# Patient Record
Sex: Male | Born: 1990 | Race: White | Hispanic: No | Marital: Single | Smoking: Current every day smoker
Health system: Southern US, Community
[De-identification: ages and names within clinical notes are randomized; demographics above are authoritative.]

## PROBLEM LIST (undated history)

## (undated) ENCOUNTER — Ambulatory Visit: Admission: EM

## (undated) ENCOUNTER — Ambulatory Visit

## (undated) HISTORY — PX: WISDOM TOOTH EXTRACTION: SHX21

## (undated) HISTORY — PX: TONSILLECTOMY: SUR1361

---
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
## 2008-02-08 ENCOUNTER — Emergency Department (HOSPITAL_COMMUNITY): Admission: EM | Admit: 2008-02-08 | Discharge: 2008-02-08 | Payer: Self-pay | Admitting: Emergency Medicine

## 2013-03-12 ENCOUNTER — Encounter (HOSPITAL_COMMUNITY): Payer: Self-pay | Admitting: Emergency Medicine

## 2013-03-12 ENCOUNTER — Emergency Department (HOSPITAL_COMMUNITY)
Admission: EM | Admit: 2013-03-12 | Discharge: 2013-03-12 | Disposition: A | Discharge status: Home / Self Care | Payer: Self-pay | Attending: Emergency Medicine | Admitting: Emergency Medicine

## 2013-03-12 DIAGNOSIS — F17200 Nicotine dependence, unspecified, uncomplicated: Secondary | ICD-10-CM | POA: Insufficient documentation

## 2013-03-12 DIAGNOSIS — H9190 Unspecified hearing loss, unspecified ear: Secondary | ICD-10-CM | POA: Insufficient documentation

## 2013-03-12 DIAGNOSIS — Z792 Long term (current) use of antibiotics: Secondary | ICD-10-CM | POA: Insufficient documentation

## 2013-03-12 DIAGNOSIS — H60399 Other infective otitis externa, unspecified ear: Secondary | ICD-10-CM | POA: Insufficient documentation

## 2013-03-12 DIAGNOSIS — J029 Acute pharyngitis, unspecified: Secondary | ICD-10-CM | POA: Insufficient documentation

## 2013-03-12 DIAGNOSIS — H6091 Unspecified otitis externa, right ear: Secondary | ICD-10-CM

## 2013-03-12 MED ORDER — NEOMYCIN-POLYMYXIN-HC 1 % OT SOLN
4.0000 [drp] | Freq: Once | OTIC | Status: AC
  Administered 2013-03-12: 4 [drp] via OTIC

## 2013-03-12 MED ORDER — AMOXICILLIN 250 MG PO CAPS
500.0000 mg | ORAL_CAPSULE | Freq: Once | ORAL | Status: AC
  Administered 2013-03-12: 500 mg via ORAL
  Filled 2013-03-12: qty 2

## 2013-03-12 MED ORDER — AMOXICILLIN 500 MG PO CAPS: 500.0000 mg | ORAL_CAPSULE | Freq: Three times a day (TID) | ORAL | Status: DC

## 2013-03-12 MED ORDER — HYDROCODONE-ACETAMINOPHEN 5-325 MG PO TABS
1.0000 | ORAL_TABLET | Freq: Once | ORAL | Status: AC
  Administered 2013-03-12: 1 via ORAL
  Filled 2013-03-12: qty 1

## 2013-03-12 MED ORDER — NEOMYCIN-POLYMYXIN-HC 1 % OT SOLN
OTIC | Status: AC
  Filled 2013-03-12: qty 10

## 2013-03-12 MED ORDER — HYDROCODONE-ACETAMINOPHEN 5-325 MG PO TABS: 1.0000 | ORAL_TABLET | ORAL | Status: DC | PRN

## 2013-03-12 NOTE — ED Provider Notes (Signed)
Medical screening examination/treatment/procedure(s) were performed by non-physician practitioner and as supervising physician I was immediately available for consultation/collaboration.  Geoffery Lyons, MD 03/12/13 [phone]

## 2013-03-12 NOTE — ED Notes (Signed)
Pt c/o right ear pain x 2-3 days with difficulty hearing from right side.  C/o pain extending down into the jaw and into the left ear.

## 2013-03-12 NOTE — ED Provider Notes (Signed)
CSN: [id number]     Arrival date & time 03/12/13  2021 History   First MD Initiated Contact with Patient 03/12/13 2031     Chief Complaint  Patient presents with  . Otalgia   (Consider location/radiation/quality/duration/timing/severity/associated sxs/prior Treatment) Patient is a 22 y.o. male presenting with ear pain. The history is provided by the patient and a parent.  Otalgia Location:  Right Quality:  Aching, pressure and throbbing Severity:  Severe Onset quality:  Gradual Duration:  3 days Progression:  Worsening Chronicity:  New Context: not direct blow and not foreign body in ear   Relieved by:  Nothing Worsened by:  Swallowing Ineffective treatments:  OTC medications Associated symptoms: ear discharge, fever, hearing loss and sore throat   Associated symptoms: no abdominal pain, no congestion, no headaches, no neck pain, no rash, no tinnitus and no vomiting     History reviewed. No pertinent past medical history. Past Surgical History  Procedure Laterality Date  . Tonsillectomy     No family history on file. History  Substance Use Topics  . Smoking status: Current Every Day Smoker -- 0.75 packs/day    Types: Cigarettes  . Smokeless tobacco: Not on file  . Alcohol Use: Yes     Comment: occasional    Review of Systems  Constitutional: Positive for fever.  HENT: Positive for ear discharge, ear pain, hearing loss and sore throat. Negative for congestion and tinnitus.   Eyes: Negative.   Respiratory: Negative for chest tightness and shortness of breath.   Cardiovascular: Negative for chest pain.  Gastrointestinal: Negative for nausea, vomiting and abdominal pain.  Genitourinary: Negative.   Musculoskeletal: Negative for arthralgias, joint swelling and neck pain.  Skin: Negative.  Negative for rash and wound.  Neurological: Negative for dizziness, weakness, light-headedness, numbness and headaches.  Psychiatric/Behavioral: Negative.     Allergies  Review of  patient's allergies indicates no known allergies.  Home Medications   Current Outpatient Rx  Name  Route  Sig  Dispense  Refill  . amoxicillin (AMOXIL) 500 MG capsule   Oral   Take 1 capsule (500 mg total) by mouth 3 (three) times daily.   30 capsule   0   . HYDROcodone-acetaminophen (NORCO/VICODIN) 5-325 MG per tablet   Oral   Take 1 tablet by mouth every 4 (four) hours as needed for pain.   15 tablet   0    BP 149/67  Pulse 120  Temp(Src) 99.1 F (37.3 C) (Oral)  Resp 20  Ht 6\' 3"  (1.905 m)  Wt 285 lb (129.275 kg)  BMI 35.62 kg/m2  SpO2 100% Physical Exam  Constitutional: He is oriented to person, place, and time. He appears well-developed and well-nourished.  HENT:  Head: Normocephalic and atraumatic.  Right Ear: There is swelling and tenderness. No foreign bodies. No mastoid tenderness. Tympanic membrane is not injected.  Left Ear: Tympanic membrane and ear canal normal.  Nose: Mucosal edema and rhinorrhea present.  Mouth/Throat: Uvula is midline, oropharynx is clear and moist and mucous membranes are normal. No oropharyngeal exudate, posterior oropharyngeal edema, posterior oropharyngeal erythema or tonsillar abscesses.  Unable to visualize TM due to external canal edema. No drainage but edges of canal slightly wet.  Pain with ear traction.  Eyes: Conjunctivae are normal.  Cardiovascular: Normal rate and normal heart sounds.   Pulmonary/Chest: Effort normal. No respiratory distress. He has no wheezes. He has no rales.  Abdominal: Soft. There is no tenderness.  Musculoskeletal: Normal range of motion.  Neurological:  He is alert and oriented to person, place, and time.  Skin: Skin is warm and dry. No rash noted.  Psychiatric: He has a normal mood and affect.    ED Course  Procedures (including critical care time)   Ear wick placed in right ear canal without difficulty,  Pt tolerated well.  Given first dose of cortisporin, amoxil here.   Labs Review Labs  Reviewed - No data to display Imaging Review No results found.  EKG Interpretation   None       MDM   1. Otitis externa of right ear    Otitis externa with possible otitis media given fevers noted at home.  He will be covered with cortisporin but also with amoxil.  Hydrocodone for pain relief.  Ibuprofen.  Recheck in 2 days for wick removal.    Burgess Amor, PA-C 03/12/13 2136

## 2014-03-27 ENCOUNTER — Emergency Department (HOSPITAL_COMMUNITY)
Admission: EM | Admit: 2014-03-27 | Discharge: 2014-03-27 | Disposition: A | Discharge status: Home / Self Care | Payer: Self-pay | Attending: Emergency Medicine | Admitting: Emergency Medicine

## 2014-03-27 ENCOUNTER — Encounter (HOSPITAL_COMMUNITY): Payer: Self-pay | Admitting: Emergency Medicine

## 2014-03-27 DIAGNOSIS — H02811 Retained foreign body in right upper eyelid: Secondary | ICD-10-CM | POA: Insufficient documentation

## 2014-03-27 DIAGNOSIS — Y9389 Activity, other specified: Secondary | ICD-10-CM | POA: Insufficient documentation

## 2014-03-27 DIAGNOSIS — Z23 Encounter for immunization: Secondary | ICD-10-CM | POA: Insufficient documentation

## 2014-03-27 DIAGNOSIS — Z720 Tobacco use: Secondary | ICD-10-CM | POA: Insufficient documentation

## 2014-03-27 DIAGNOSIS — Z1889 Other specified retained foreign body fragments: Secondary | ICD-10-CM | POA: Insufficient documentation

## 2014-03-27 DIAGNOSIS — Z79899 Other long term (current) drug therapy: Secondary | ICD-10-CM | POA: Insufficient documentation

## 2014-03-27 DIAGNOSIS — Y9289 Other specified places as the place of occurrence of the external cause: Secondary | ICD-10-CM | POA: Insufficient documentation

## 2014-03-27 DIAGNOSIS — Z792 Long term (current) use of antibiotics: Secondary | ICD-10-CM | POA: Insufficient documentation

## 2014-03-27 DIAGNOSIS — X58XXXA Exposure to other specified factors, initial encounter: Secondary | ICD-10-CM | POA: Insufficient documentation

## 2014-03-27 DIAGNOSIS — S0501XA Injury of conjunctiva and corneal abrasion without foreign body, right eye, initial encounter: Secondary | ICD-10-CM | POA: Insufficient documentation

## 2014-03-27 MED ORDER — SULFACETAMIDE SODIUM 10 % OP SOLN: 2.0000 [drp] | OPHTHALMIC | Status: DC

## 2014-03-27 MED ORDER — TETANUS-DIPHTH-ACELL PERTUSSIS 5-2.5-18.5 LF-MCG/0.5 IM SUSP
0.5000 mL | Freq: Once | INTRAMUSCULAR | Status: AC
  Administered 2014-03-27: 0.5 mL via INTRAMUSCULAR
  Filled 2014-03-27: qty 0.5

## 2014-03-27 MED ORDER — TETRACAINE HCL 0.5 % OP SOLN
OPHTHALMIC | Status: AC
  Filled 2014-03-27: qty 2

## 2014-03-27 MED ORDER — FLUORESCEIN SODIUM 1 MG OP STRP
ORAL_STRIP | OPHTHALMIC | Status: AC
  Filled 2014-03-27: qty 1

## 2014-03-27 NOTE — ED Notes (Signed)
Tetracaine and fluorescein strip used by Dr. Preston FleetingGlick prior to examining eye.

## 2014-03-27 NOTE — ED Provider Notes (Signed)
CSN: [id number]     Arrival date & time 03/27/14  0520 History   First MD Initiated Contact with Patient 03/27/14 [phone]     Chief Complaint  Patient presents with  . Eye Problem     (Consider location/radiation/quality/duration/timing/severity/associated sxs/prior Treatment) Patient is a 23 y.o. male presenting with eye problem. The history is provided by the patient.  Eye Problem He was working underneath a car when he felt something fall into his right eye. He has a foreign body sensation in his eye. He tried to rinse the eye out but it continues to have the same sensation. He rates pain at 1/10. He states it's more irritating than painful. It feels similar to how his eyes felt when he had a welder's burn. He does not know when his last tetanus immunization was. He denies other injury.  History reviewed. No pertinent past medical history. Past Surgical History  Procedure Laterality Date  . Tonsillectomy     History reviewed. No pertinent family history. History  Substance Use Topics  . Smoking status: Current Every Day Smoker -- 0.75 packs/day    Types: Cigarettes  . Smokeless tobacco: Not on file  . Alcohol Use: Yes     Comment: occasional    Review of Systems  All other systems reviewed and are negative.     Allergies  Review of patient's allergies indicates no known allergies.  Home Medications   Prior to Admission medications   Medication Sig Start Date End Date Taking? Authorizing Provider  amoxicillin (AMOXIL) 500 MG capsule Take 1 capsule (500 mg total) by mouth 3 (three) times daily. 03/12/13   Burgess AmorJulie Idol, PA-C  HYDROcodone-acetaminophen (NORCO/VICODIN) 5-325 MG per tablet Take 1 tablet by mouth every 4 (four) hours as needed for pain. 03/12/13   Burgess AmorJulie Idol, PA-C   BP 135/75 mmHg  Pulse 92  Temp(Src) 98.1 F (36.7 C) (Oral)  Resp 18  SpO2 98% Physical Exam  Nursing note and vitals reviewed.  23 year old male, resting comfortably and in no acute distress.  Vital signs are normal. Oxygen saturation is 98%, which is normal. Head is normocephalic and atraumatic. PERRLA, EOMI. Oropharynx is clear.there is moderate erythema of the conjunctiva the right. No foreign body is seen on direct evaluation without magnification. Anterior chamber is clear.upper and lower conjunctival sacs were inspected after eversion of lids and no foreign bodies were seen. Neck is nontender and supple without adenopathy or JVD. Back is nontender and there is no CVA tenderness. Lungs are clear without rales, wheezes, or rhonchi. Chest is nontender. Heart has regular rate and rhythm without murmur. Abdomen is soft, flat, nontender without masses or hepatosplenomegaly and peristalsis is normoactive. Extremities have no cyanosis or edema, full range of motion is present. Skin is warm and dry without rash. Neurologic: Mental status is normal, cranial nerves are intact, there are no motor or sensory deficits.  ED Course  Procedures (including critical care time)  Slit lamp exam: no corneal foreign body seen. Anterior chambers clear. Eye was stained with floor seen and examined with cobalt blue filter and there were numerous shallow abrasions noted on the upper half of the cornea which all appeared to be U-shaped. This was suspicious for corneal abrasions from foreign body in the upper lid conjunctiva. Lids were everted again and examined under magnification and small foreign body was found and removed with cotton tip applicator.  MDM   Final diagnoses:  Retained foreign body of right upper eyelid  Injury of  conjunctiva and corneal abrasion of right eye without foreign body, initial encounter    Forearm body of the conjunctiva of the right upper eyelid with secondary corneal abrasion. He is discharged with a prescription for sulfacetamide ophthalmic solution. He was offered prescription for narcotic painkillers but stated that he did not want any medication like that. TDaP booster  is given. He is referred to ophthalmology for follow-up if not improving over the next several days.    Dione Boozeavid Laurine Kuyper, MD 03/27/14 [phone]

## 2014-03-27 NOTE — Discharge Instructions (Signed)
Corneal Abrasion The cornea is the clear covering at the front and center of the eye. When looking at the colored portion of the eye (iris), you are looking through the cornea. This very thin tissue is made up of many layers. The surface layer is a single layer of cells (corneal epithelium) and is one of the most sensitive tissues in the body. If a scratch or injury causes the corneal epithelium to come off, it is called a corneal abrasion. If the injury extends to the tissues below the epithelium, the condition is called a corneal ulcer. CAUSES   Scratches.  Trauma.  Foreign body in the eye. Some people have recurrences of abrasions in the area of the original injury even after it has healed (recurrent erosion syndrome). Recurrent erosion syndrome generally improves and goes away with time. SYMPTOMS   Eye pain.  Difficulty or inability to keep the injured eye open.  The eye becomes very sensitive to light.  Recurrent erosions tend to happen suddenly, first thing in the morning, usually after waking up and opening the eye. DIAGNOSIS  Your health care provider can diagnose a corneal abrasion during an eye exam. Dye is usually placed in the eye using a drop or a small paper strip moistened by your tears. When the eye is examined with a special light, the abrasion shows up clearly because of the dye. TREATMENT   Small abrasions may be treated with antibiotic drops or ointment alone.  A pressure patch may be put over the eye. If this is done, follow your doctor's instructions for when to remove the patch. Do not drive or use machines while the eye patch is on. Judging distances is hard to do with a patch on. If the abrasion becomes infected and spreads to the deeper tissues of the cornea, a corneal ulcer can result. This is serious because it can cause corneal scarring. Corneal scars interfere with light passing through the cornea and cause a loss of vision in the involved eye. HOME CARE  INSTRUCTIONS  Use medicine or ointment as directed. Only take over-the-counter or prescription medicines for pain, discomfort, or fever as directed by your health care provider.  Do not drive or operate machinery if your eye is patched. Your ability to judge distances is impaired.  If your health care provider has given you a follow-up appointment, it is very important to keep that appointment. Not keeping the appointment could result in a severe eye infection or permanent loss of vision. If there is any problem keeping the appointment, let your health care provider know. SEEK MEDICAL CARE IF:   You have pain, light sensitivity, and a scratchy feeling in one eye or both eyes.  Your pressure patch keeps loosening up, and you can blink your eye under the patch after treatment.  Any kind of discharge develops from the eye after treatment or if the lids stick together in the morning.  You have the same symptoms in the morning as you did with the original abrasion days, weeks, or months after the abrasion healed. MAKE SURE YOU:   Understand these instructions.  Will watch your condition.  Will get help right away if you are not doing well or get worse. Document Released: 05/06/2000 Document Revised: 05/14/2013 Document Reviewed: 01/14/2013 [facility] Patient Information 2015 [place], [place]. This information is not intended to replace advice given to you by your health care provider. Make sure you discuss any questions you have with your health care provider.  Prednisolone; Sulfacetamide  eye solution or suspension What is this medicine? PREDNISOLONE; SULFACETAMIDE (pred NISS oh lone; sul fa SEE ta mide) is a combination of a steroid and a sulfa antibiotic. It helps to reduce swelling, redness, and itching of the eye. It also is used to treat eye infections. This medicine may be used for other purposes; ask your health care provider or pharmacist if you have questions. COMMON BRAND NAME(S):  Blephamide, Optimyd, Sulster, Vasocidin What should I tell my health care provider before I take this medicine? They need to know if you have any of these conditions: -infection (especially a viral infection such as chickenpox, cold sores, or herpes) -cataracts -glaucoma -wear contact lenses -an unusual or allergic reaction to sulfa drugs, corticosteroids, other medicines, foods, dyes, or preservatives -pregnant or trying to get pregnant -breast-feeding How should I use this medicine? This medicine is only for use in the eye. Do not take by mouth. Follow the directions on the prescription label. Wash hands before and after use. Shake well before using. Tilt your head back slightly and pull your lower eyelid down with your index finger to form a pouch. Try not to touch the tip of the dropper to your eye, fingertips, or any other surface. Squeeze the prescribed number of drops into the pouch. Close the eye gently to spread the drops. Your vision may blur for a few minutes. Use your doses at regular intervals. Do not use your medicine more often than directed. Finish the full course prescribed by your doctor or health care professional even if you think your condition is better. Do not stop using except on the advice of your doctor or health care professional. Talk to your pediatrician regarding the use of this medicine in children. Special care may be needed. Overdosage: If you think you have taken too much of this medicine contact a poison control center or emergency room at once. NOTE: This medicine is only for you. Do not share this medicine with others. What if I miss a dose? If you miss a dose, use it as soon as you can. If it is almost time for your next dose, use only that dose. Do not use double or extra doses. What may interact with this medicine? Do not use any other eye products without talking to your doctor or health care professional. -anesthetics applied topically to the eye This  list may not describe all possible interactions. Give your health care provider a list of all the medicines, herbs, non-prescription drugs, or dietary supplements you use. Also tell them if you smoke, drink alcohol, or use illegal drugs. Some items may interact with your medicine. What should I watch for while using this medicine? Check with your health care provider if your condition does not start to get better after 2 days, or if it gets worse. Check with your health care provider before using this medicine for any future eye problems. If you get any sign of an allergic reaction, stop using your eye product and call your doctor or health care professional for advice. Wear sunglasses if this medicine makes your eyes more sensitive to light. What side effects may I notice from receiving this medicine? Side effects that you should report to your doctor or health care professional as soon as possible: -allergic reactions like skin rash, itching or hives, swelling of the face, lips, or tongue -changes in vision -eye pain -severe burning, stinging or swelling of the eyelids Side effects that usually do not require medical attention (report  to your doctor or health care professional if they continue or are bothersome): -blurred vision for a few moments after application -temporary redness or stinging for a few moments after application -temporary watering of eyes This list may not describe all possible side effects. Call your doctor for medical advice about side effects. You may report side effects to FDA at 1-800-FDA-1088. Where should I keep my medicine? Keep out of the reach of children. Store at room temperature between 15 and 30 degrees C (59 and 86 degrees F). Do not freeze. Protect from light. Throw away any unused medicine after the expiration date. NOTE: This sheet is a summary. It may not cover all possible information. If you have questions about this medicine, talk to your doctor,  pharmacist, or health care provider.  2015, [employer]vier/[employer] Standard. (2010-06-16 13:25:56)  Td Vaccine (Tetanus and Diphtheria): What You Need to Know 1. Why get vaccinated? Tetanus  and diphtheria are very serious diseases. They are rare in the [place] today, but people who do become infected often have severe complications. Td vaccine is used to protect adolescents and adults from both of these diseases. Both tetanus and diphtheria are infections caused by bacteria. Diphtheria spreads from person to person through coughing or sneezing. Tetanus-causing bacteria enter the body through cuts, scratches, or wounds. TETANUS (Lockjaw) causes painful muscle tightening and stiffness, usually all over the body.  It can lead to tightening of muscles in the head and neck so you can't open your mouth, swallow, or sometimes even breathe. Tetanus kills about 1 out of every 5 people who are infected. DIPHTHERIA can cause a thick coating to form in the back of the throat.  It can lead to breathing problems, paralysis, heart failure, and death. Before vaccines, the [place] saw as many as 200,000 cases a year of diphtheria and hundreds of cases of tetanus. Since vaccination began, cases of both diseases have dropped by about 99%. 2. Td vaccine Td vaccine can protect adolescents and adults from tetanus and diphtheria. Td is usually given as a booster dose every 10 years but it can also be given earlier after a severe and dirty wound or burn. Your doctor can give you more information. Td may safely be given at the same time as other vaccines. 3. Some people should not get this vaccine  If you ever had a life-threatening allergic reaction after a dose of any tetanus or diphtheria containing vaccine, OR if you have a severe allergy to any part of this vaccine, you should not get Td. Tell your doctor if you have any severe allergies.  Talk to your doctor if you:  have epilepsy or another nervous system  problem,  had severe pain or swelling after any vaccine containing diphtheria or tetanus,  ever had Guillain Barr Syndrome (GBS),  aren't feeling well on the day the shot is scheduled. 4. Risks of a vaccine reaction With a vaccine, like any medicine, there is a chance of side effects. These are usually mild and go away on their own. Serious side effects are also possible, but are very rare. Most people who get Td vaccine do not have any problems with it. Mild Problems  following Td (Did not interfere with activities)  Pain where the shot was given (about 8 people in 10)  Redness or swelling where the shot was given (about 1 person in 3)  Mild fever (about 1 person in 15)  Headache or Tiredness (uncommon) Moderate Problems following Td (Interfered with  activities, but did not require medical attention)  Fever over 102F (rare) Severe Problems  following Td (Unable to perform usual activities; required medical attention)  Swelling, severe pain, bleeding and/or redness in the arm where the shot was given (rare). Problems that could happen after any vaccine:  Brief fainting spells can happen after any medical procedure, including vaccination. Sitting or lying down for about 15 minutes can help prevent fainting, and injuries caused by a fall. Tell your doctor if you feel dizzy, or have vision changes or ringing in the ears.  Severe shoulder pain and reduced range of motion in the arm where a shot was given can happen, very rarely, after a vaccination.  Severe allergic reactions from a vaccine are very rare, estimated at less than 1 in a million doses. If one were to occur, it would usually be within a few minutes to a few hours after the vaccination. 5. What if there is a serious reaction? What should I look for?  Look for anything that concerns you, such as signs of a severe allergic reaction, very high fever, or behavior changes. Signs of a severe allergic reaction can include  hives, swelling of the face and throat, difficulty breathing, a fast heartbeat, dizziness, and weakness. These would usually start a few minutes to a few hours after the vaccination. What should I do?  If you think it is a severe allergic reaction or other emergency that can't wait, call 9-1-1 or get the person to the nearest hospital. Otherwise, call your doctor.  Afterward, the reaction should be reported to the Vaccine Adverse Event Reporting System (VAERS). Your doctor might file this report, or you can do it yourself through the VAERS web site at www.vaers.LAgents.no, or by calling 1-847-741-6307. VAERS is only for reporting reactions. They do not give medical advice. 6. The National Vaccine Injury Compensation Program The [employer] Vaccine Injury Compensation Program (VICP) is a federal program that was created to compensate people who may have been injured by certain vaccines. Persons who believe they may have been injured by a vaccine can learn about the program and about filing a claim by calling 1-236-636-8136 or visiting the VICP website at SpiritualWord.at. 7. How can I learn more?  Ask your doctor.  Contact your local or state health department.  Contact the Centers for Disease Control and Prevention (CDC):  Call 684-272-3464 (1-800-CDC-INFO)  Visit CDC's website at PicCapture.uy CDC Td Vaccine Interim VIS (06/26/12) Document Released: 03/06/2006 Document Revised: 09/23/2013 Document Reviewed: 08/21/2013 [facility] Patient Information 2015 [place], [place]. This information is not intended to replace advice given to you by your health care provider. Make sure you discuss any questions you have with your health care provider.

## 2014-03-27 NOTE — ED Notes (Signed)
Patient was working on a car when he felt something fall in his right eye.

## 2016-11-07 ENCOUNTER — Encounter (HOSPITAL_COMMUNITY): Payer: Self-pay | Admitting: Emergency Medicine

## 2016-11-07 ENCOUNTER — Emergency Department (HOSPITAL_COMMUNITY): Payer: Self-pay

## 2016-11-07 ENCOUNTER — Emergency Department (HOSPITAL_COMMUNITY)
Admission: EM | Admit: 2016-11-07 | Discharge: 2016-11-07 | Disposition: A | Discharge status: Home / Self Care | Payer: Self-pay | Attending: Emergency Medicine | Admitting: Emergency Medicine

## 2016-11-07 DIAGNOSIS — F17210 Nicotine dependence, cigarettes, uncomplicated: Secondary | ICD-10-CM | POA: Insufficient documentation

## 2016-11-07 DIAGNOSIS — R509 Fever, unspecified: Secondary | ICD-10-CM | POA: Insufficient documentation

## 2016-11-07 LAB — CBC WITH DIFFERENTIAL/PLATELET
Basophils Absolute: 0.2 10*3/uL — ABNORMAL HIGH (ref 0.0–0.1)
Basophils Relative: 2 %
EOS ABS: 0.2 10*3/uL (ref 0.0–0.7)
Eosinophils Relative: 1 %
HCT: 47.3 % (ref 39.0–52.0)
HEMOGLOBIN: 16.3 g/dL (ref 13.0–17.0)
Lymphocytes Relative: 47 %
Lymphs Abs: 5.7 10*3/uL — ABNORMAL HIGH (ref 0.7–4.0)
MCH: 28.8 pg (ref 26.0–34.0)
MCHC: 34.5 g/dL (ref 30.0–36.0)
MCV: 83.6 fL (ref 78.0–100.0)
Monocytes Absolute: 1.2 10*3/uL — ABNORMAL HIGH (ref 0.1–1.0)
Monocytes Relative: 10 %
NEUTROS ABS: 4.7 10*3/uL (ref 1.7–7.7)
Neutrophils Relative %: 39 %
Platelets: 222 10*3/uL (ref 150–400)
RBC: 5.66 MIL/uL (ref 4.22–5.81)
RDW: 13.3 % (ref 11.5–15.5)
WBC: 12 10*3/uL — ABNORMAL HIGH (ref 4.0–10.5)

## 2016-11-07 LAB — COMPREHENSIVE METABOLIC PANEL
ALT: 46 U/L (ref 17–63)
AST: 36 U/L (ref 15–41)
Albumin: 3.6 g/dL (ref 3.5–5.0)
Alkaline Phosphatase: 67 U/L (ref 38–126)
Anion gap: 9 (ref 5–15)
BUN: 6 mg/dL (ref 6–20)
CO2: 24 mmol/L (ref 22–32)
Calcium: 8.7 mg/dL — ABNORMAL LOW (ref 8.9–10.3)
Chloride: 100 mmol/L — ABNORMAL LOW (ref 101–111)
Creatinine, Ser: 1.02 mg/dL (ref 0.61–1.24)
GFR calc non Af Amer: >60 mL/min (ref >60)
Glucose, Bld: 123 mg/dL — ABNORMAL HIGH (ref 65–99)
Potassium: 3.9 mmol/L (ref 3.5–5.1)
SODIUM: 133 mmol/L — AB (ref 135–145)
TOTAL PROTEIN: 7.3 g/dL (ref 6.5–8.1)
Total Bilirubin: 0.6 mg/dL (ref 0.3–1.2)

## 2016-11-07 MED ORDER — DOXYCYCLINE HYCLATE 100 MG PO TABS
100.0000 mg | ORAL_TABLET | Freq: Once | ORAL | Status: AC
  Administered 2016-11-07: 100 mg via ORAL
  Filled 2016-11-07: qty 1

## 2016-11-07 MED ORDER — ACETAMINOPHEN 325 MG PO TABS
650.0000 mg | ORAL_TABLET | Freq: Once | ORAL | Status: AC
  Administered 2016-11-07: 650 mg via ORAL
  Filled 2016-11-07: qty 2

## 2016-11-07 MED ORDER — DOXYCYCLINE HYCLATE 100 MG PO CAPS: 100.0000 mg | ORAL_CAPSULE | Freq: Two times a day (BID) | ORAL | 0 refills | Status: DC

## 2016-11-07 NOTE — ED Triage Notes (Signed)
PT states intermittent fever with hot/cold chills, body aches, headache, and nausea x3 weeks. PT denies any OTC medications today. PT states increased generalized weakness and denies any urinary symptoms.

## 2016-11-07 NOTE — ED Provider Notes (Signed)
[facility]EM[facility]ENCY DEPT Provider Note   CSN: [id number] Arrival date & time: 11/07/16  1453     History   Chief Complaint Chief Complaint  Patient presents with  . Fever    HPI Bobby Crane is a 26 y.o. male.  The history is provided by the patient. No language interpreter was used.  Fever   This is a new problem. Episode onset: 3 weeks. The problem occurs constantly. The problem has not changed since onset.The maximum temperature noted was 101 to 101.9 F. Associated symptoms include vomiting and headaches. Pertinent negatives include no chest pain, no sore throat, no muscle aches and no cough. He has tried nothing for the symptoms. The treatment provided no relief.  Pt reports he has had a headache for 3 weeks.  Pt reports he has had fevers on and off for 3 weeks.  History reviewed. No pertinent past medical history.  There are no active problems to display for this patient.   Past Surgical History:  Procedure Laterality Date  . TONSILLECTOMY    . WISDOM TOOTH EXTRACTION         Home Medications    Prior to Admission medications   Medication Sig Start Date End Date Taking? Authorizing Provider  amoxicillin (AMOXIL) 500 MG capsule Take 1 capsule (500 mg total) by mouth 3 (three) times daily. 03/12/13   Burgess AmorIdol, Julie, PA-C  doxycycline (VIBRAMYCIN) 100 MG capsule Take 1 capsule (100 mg total) by mouth 2 (two) times daily. 11/07/16   Elson AreasSofia, Leslie K, PA-C  HYDROcodone-acetaminophen (NORCO/VICODIN) 5-325 MG per tablet Take 1 tablet by mouth every 4 (four) hours as needed for pain. 03/12/13   Burgess AmorIdol, Julie, PA-C  sulfacetamide (BLEPH-10) 10 % ophthalmic solution Place 2 drops into the right eye every 3 (three) hours while awake. 03/27/14   Dione BoozeGlick, David, MD    Family History History reviewed. No pertinent family history.  Social History Social History  Substance Use Topics  . Smoking status: Current Every Day Smoker    Packs/day: 1.00    Types: Cigarettes  . Smokeless  tobacco: Never Used  . Alcohol use No     Comment: occasional     Allergies   Patient has no known allergies.   Review of Systems Review of Systems  Constitutional: Positive for fever.  HENT: Negative for sore throat.   Respiratory: Negative for cough.   Cardiovascular: Negative for chest pain.  Gastrointestinal: Positive for vomiting.  Neurological: Positive for headaches.  All other systems reviewed and are negative.    Physical Exam Updated Vital Signs BP 138/77 (BP Location: Left Arm)   Pulse 92   Temp 99.6 F (37.6 C) (Oral)   Resp 18   Ht 6\' 2"  (1.88 m)   Wt (!) 139.7 kg (308 lb)   SpO2 97%   BMI 39.54 kg/m   Physical Exam  Constitutional: He appears well-developed and well-nourished.  HENT:  Head: Normocephalic and atraumatic.  Eyes: Conjunctivae are normal.  Neck: Neck supple.  Cardiovascular: Normal rate and regular rhythm.   No murmur heard. Pulmonary/Chest: Effort normal and breath sounds normal. No respiratory distress.  Abdominal: Soft. There is no tenderness.  Musculoskeletal: He exhibits no edema.  Neurological: He is alert.  Skin: Skin is warm and dry.  Possible few erythematous spots lower extremities.  (Pt thinks is normal, girlfriend reports ma be different)  Psychiatric: He has a normal mood and affect.  Nursing note and vitals reviewed.    ED Treatments / Results  Labs (all labs ordered are listed, but only abnormal results are displayed) Labs Reviewed  CBC WITH DIFFERENTIAL/PLATELET - Abnormal; Notable for the following:       Result Value   WBC 12.0 (*)    Lymphs Abs 5.7 (*)    Monocytes Absolute 1.2 (*)    Basophils Absolute 0.2 (*)    All other components within normal limits  COMPREHENSIVE METABOLIC PANEL - Abnormal; Notable for the following:    Sodium 133 (*)    Chloride 100 (*)    Glucose, Bld 123 (*)    Calcium 8.7 (*)    All other components within normal limits  PATHOLOGIST SMEAR REVIEW  B. BURGDORFI ANTIBODIES    ROCKY MTN SPOTTED FVR ABS PNL(IGG+IGM)    EKG  EKG Interpretation None       Radiology Dg Chest 2 View  Result Date: 11/07/2016 CLINICAL DATA:  PT states intermittent fever with hot/cold chills, body aches, headache, and nausea x3 weeks. PT denies any OTC medications today. PT states increased generalized weakness and denies any urinary symptoms. EXAM: CHEST  2 VIEW COMPARISON:  None. FINDINGS: Midline trachea.  Normal heart size and mediastinal contours. Sharp costophrenic angles.  No pneumothorax.  Clear lungs. Lateral view degraded by patient arm position. IMPRESSION: No active cardiopulmonary disease. Electronically Signed   By: Jeronimo Greaves M.D.   On: 11/07/2016 17:43    Procedures Procedures (including critical care time)  Medications Ordered in ED Medications  acetaminophen (TYLENOL) tablet 650 mg (650 mg Oral Given 11/07/16 1513)  doxycycline (VIBRA-TABS) tablet 100 mg (100 mg Oral Given 11/07/16 1728)     Initial Impression / Assessment and Plan / ED Course  I have reviewed the triage vital signs and the nursing notes.  Pertinent labs & imaging results that were available during my care of the patient were reviewed by me and considered in my medical decision making (see chart for details).  Clinical Course as of Nov 07 1928  Mon Nov 07, 2016  1705 WBC Morphology: WHITE COUNT CONFIRMED ON SMEAR [LS]    Clinical Course User Index [LS] Elson Areas, PA-C    Pt is nontoxic.   Looks good,  No meningeal signs.   He does not recall tick bites, but I will treat and send titers.    Final Clinical Impressions(s) / ED Diagnoses   Final diagnoses:  Febrile illness    New Prescriptions Discharge Medication List as of 11/07/2016  6:01 PM    START taking these medications   Details  doxycycline (VIBRAMYCIN) 100 MG capsule Take 1 capsule (100 mg total) by mouth 2 (two) times daily., Starting Mon 11/07/2016, Print      An After Visit Summary was printed and given to  the patient. Pt given strict return instructions.    Osie Cheeks 11/07/16 Nena Polio, MD 11/07/16 2329

## 2016-11-07 NOTE — Discharge Instructions (Signed)
Take antibiotics as directed.  You have test pending for tick related illnesses

## 2016-11-08 LAB — PATHOLOGIST SMEAR REVIEW

## 2016-11-08 LAB — B. BURGDORFI ANTIBODIES: B burgdorferi Ab IgG+IgM: <0.91 {ISR} (ref 0.00–0.90)

## 2016-11-09 LAB — ROCKY MTN SPOTTED FVR ABS PNL(IGG+IGM)
RMSF IgG: POSITIVE — AB
RMSF IgM: 0.43 index (ref 0.00–0.89)

## 2016-11-09 LAB — RMSF, IGG, IFA: RMSF, IGG, IFA: 1:64 {titer} — ABNORMAL HIGH

## 2017-12-14 DIAGNOSIS — R7301 Impaired fasting glucose: Secondary | ICD-10-CM | POA: Diagnosis not present

## 2017-12-14 DIAGNOSIS — E039 Hypothyroidism, unspecified: Secondary | ICD-10-CM | POA: Diagnosis not present

## 2017-12-14 DIAGNOSIS — Z0000 Encounter for general adult medical examination without abnormal findings: Secondary | ICD-10-CM | POA: Diagnosis not present

## 2017-12-14 DIAGNOSIS — R030 Elevated blood-pressure reading, without diagnosis of hypertension: Secondary | ICD-10-CM | POA: Diagnosis not present

## 2017-12-20 DIAGNOSIS — Z0001 Encounter for general adult medical examination with abnormal findings: Secondary | ICD-10-CM | POA: Diagnosis not present

## 2017-12-20 DIAGNOSIS — K219 Gastro-esophageal reflux disease without esophagitis: Secondary | ICD-10-CM | POA: Diagnosis not present

## 2017-12-20 DIAGNOSIS — R030 Elevated blood-pressure reading, without diagnosis of hypertension: Secondary | ICD-10-CM | POA: Diagnosis not present

## 2018-01-24 DIAGNOSIS — L732 Hidradenitis suppurativa: Secondary | ICD-10-CM | POA: Diagnosis not present

## 2018-01-24 DIAGNOSIS — E1165 Type 2 diabetes mellitus with hyperglycemia: Secondary | ICD-10-CM | POA: Diagnosis not present

## 2018-04-27 DIAGNOSIS — E782 Mixed hyperlipidemia: Secondary | ICD-10-CM | POA: Diagnosis not present

## 2018-04-27 DIAGNOSIS — E1165 Type 2 diabetes mellitus with hyperglycemia: Secondary | ICD-10-CM | POA: Diagnosis not present

## 2018-05-09 DIAGNOSIS — R945 Abnormal results of liver function studies: Secondary | ICD-10-CM | POA: Diagnosis not present

## 2018-05-09 DIAGNOSIS — R7303 Prediabetes: Secondary | ICD-10-CM | POA: Diagnosis not present

## 2018-05-25 DIAGNOSIS — L732 Hidradenitis suppurativa: Secondary | ICD-10-CM | POA: Diagnosis not present

## 2018-05-25 DIAGNOSIS — H6060 Unspecified chronic otitis externa, unspecified ear: Secondary | ICD-10-CM | POA: Diagnosis not present

## 2018-07-08 IMAGING — DX DG CHEST 2V
2 series · 2 of 2 positions shown · non-contrast
Comparison: None.

CLINICAL DATA: PT states intermittent fever with hot/cold chills,
body aches, headache, and nausea x3 weeks. PT denies any OTC
medications today. PT states increased generalized weakness and
denies any urinary symptoms.

EXAM:
CHEST  2 VIEW

[chest pa]
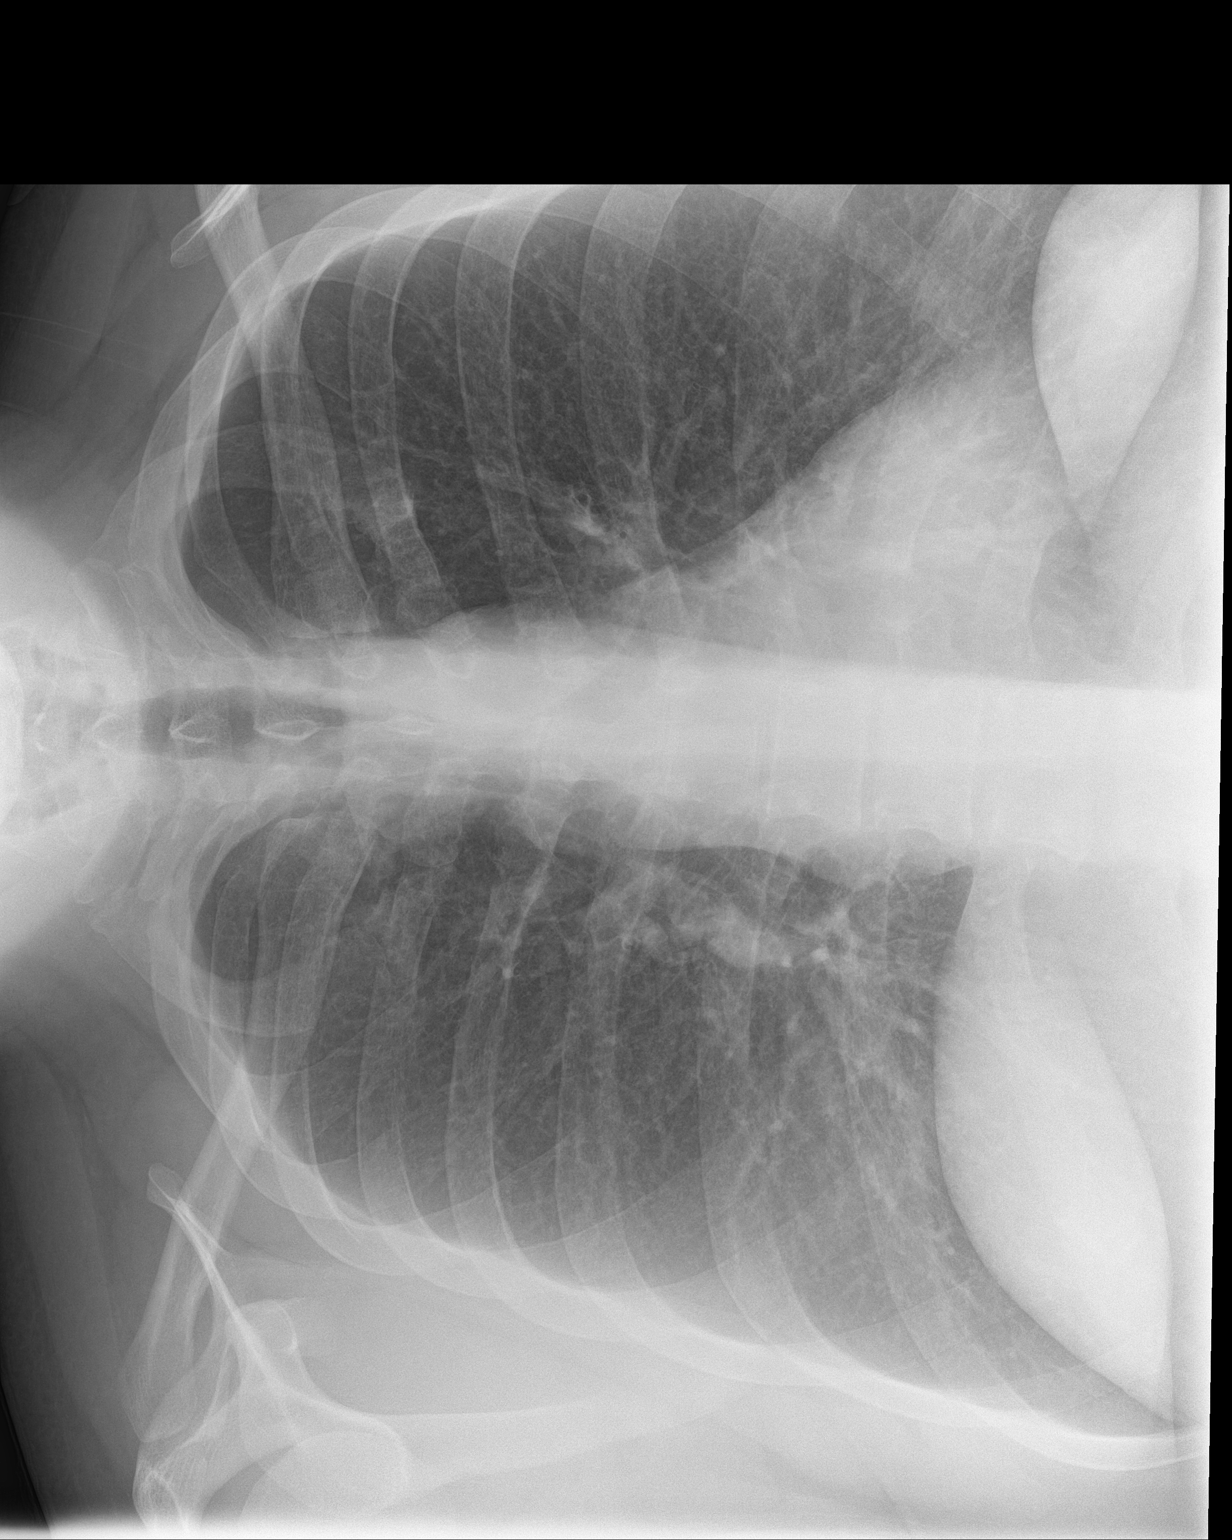

[chest lat]
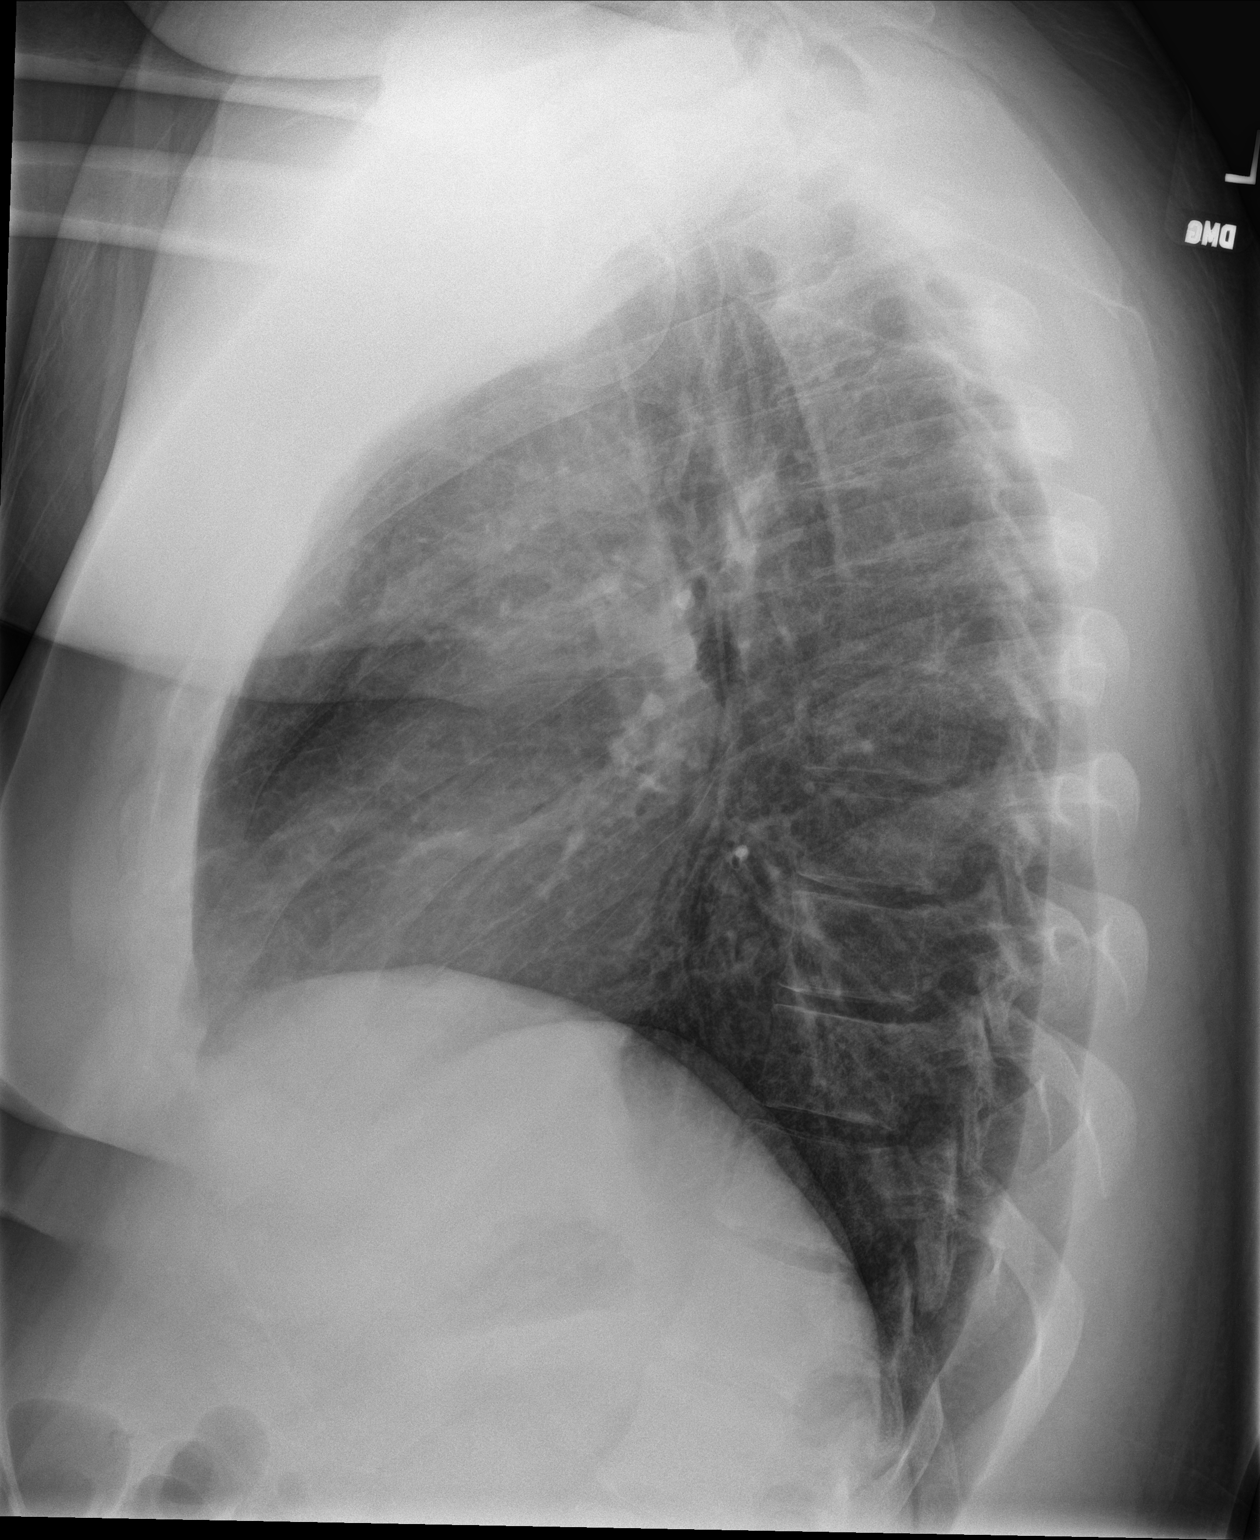

[2 of 2 positions shown; findings below may reference images not displayed]

FINDINGS: Midline trachea.  Normal heart size and mediastinal contours.

Sharp costophrenic angles.  No pneumothorax.  Clear lungs.

Lateral view degraded by patient arm position.
IMPRESSION: No active cardiopulmonary disease.

## 2019-11-19 ENCOUNTER — Ambulatory Visit (HOSPITAL_COMMUNITY)
Admission: RE | Admit: 2019-11-19 | Discharge: 2019-11-19 | Disposition: A | Discharge status: Home / Self Care | Payer: 59 | Source: Ambulatory Visit | Attending: Internal Medicine | Admitting: Internal Medicine

## 2019-11-19 ENCOUNTER — Other Ambulatory Visit (HOSPITAL_COMMUNITY): Payer: Self-pay | Admitting: Internal Medicine

## 2019-11-19 ENCOUNTER — Other Ambulatory Visit: Payer: Self-pay

## 2019-11-19 DIAGNOSIS — M545 Low back pain, unspecified: Secondary | ICD-10-CM

## 2020-08-25 ENCOUNTER — Encounter (HOSPITAL_COMMUNITY): Payer: Self-pay | Admitting: Emergency Medicine

## 2020-08-25 ENCOUNTER — Other Ambulatory Visit: Payer: Self-pay

## 2020-08-25 ENCOUNTER — Emergency Department (HOSPITAL_COMMUNITY)
Admission: EM | Admit: 2020-08-25 | Discharge: 2020-08-25 | Disposition: A | Discharge status: Home / Self Care | Payer: 59 | Attending: Emergency Medicine | Admitting: Emergency Medicine

## 2020-08-25 DIAGNOSIS — F17210 Nicotine dependence, cigarettes, uncomplicated: Secondary | ICD-10-CM | POA: Diagnosis not present

## 2020-08-25 DIAGNOSIS — K6289 Other specified diseases of anus and rectum: Secondary | ICD-10-CM | POA: Insufficient documentation

## 2020-08-25 MED ORDER — HYDROCORTISONE (PERIANAL) 2.5 % EX CREA: 1.0000 "application " | TOPICAL_CREAM | Freq: Two times a day (BID) | CUTANEOUS | 0 refills | Status: DC

## 2020-08-25 NOTE — Discharge Instructions (Addendum)
Use Anusol cream as prescribed.  Follow-up with gastroenterology in the next few days if not improving.  The contact information for the GI clinic has been provided in this discharge summary for you to call and make these arrangements.

## 2020-08-25 NOTE — ED Triage Notes (Signed)
Pt c/o rectal pain x one month.

## 2020-08-25 NOTE — ED Provider Notes (Signed)
[facility] EMERGENCY DEPARTMENT Provider Note   CSN: [id number] Arrival date & time: 08/25/20  3903     History Chief Complaint  Patient presents with  . Rectal Pain    Bobby Crane is a 30 y.o. male.  Patient is a 30 year old male with no significant past medical history.  He presents with a 1 month history of rectal pain.  He describes a constant burning in his rectum that is worse when he has a bowel movement.  He tells me he has tried stool softeners and Preparation H, however has had no relief.  He denies any blood in his stool.  He denies any fevers or chills.  The history is provided by the patient.       History reviewed. No pertinent past medical history.  There are no problems to display for this patient.   Past Surgical History:  Procedure Laterality Date  . TONSILLECTOMY    . WISDOM TOOTH EXTRACTION         No family history on file.  Social History   Tobacco Use  . Smoking status: Current Every Day Smoker    Packs/day: 1.00    Types: Cigarettes  . Smokeless tobacco: Never Used  Vaping Use  . Vaping Use: Never used  Substance Use Topics  . Alcohol use: No    Comment: occasional  . Drug use: No    Home Medications Prior to Admission medications   Medication Sig Start Date End Date Taking? Authorizing Provider  amoxicillin (AMOXIL) 500 MG capsule Take 1 capsule (500 mg total) by mouth 3 (three) times daily. 03/12/13   Burgess Amor, PA-C  doxycycline (VIBRAMYCIN) 100 MG capsule Take 1 capsule (100 mg total) by mouth 2 (two) times daily. 11/07/16   Elson Areas, PA-C  HYDROcodone-acetaminophen (NORCO/VICODIN) 5-325 MG per tablet Take 1 tablet by mouth every 4 (four) hours as needed for pain. 03/12/13   Burgess Amor, PA-C  sulfacetamide (BLEPH-10) 10 % ophthalmic solution Place 2 drops into the right eye every 3 (three) hours while awake. 03/27/14   Dione Booze, MD    Allergies    Patient has no known allergies.  Review of Systems   Review  of Systems  All other systems reviewed and are negative.   Physical Exam Updated Vital Signs BP (!) 144/97   Pulse 92   Temp 97.8 F (36.6 C)   Resp 16   Ht 6\' 2"  (1.88 m)   Wt 120.2 kg   SpO2 98%   BMI 34.02 kg/m   Physical Exam Vitals and nursing note reviewed.  Constitutional:      General: He is not in acute distress.    Appearance: Normal appearance. He is not ill-appearing, toxic-appearing or diaphoretic.  HENT:     Head: Normocephalic and atraumatic.  Pulmonary:     Effort: Pulmonary effort is normal.  Abdominal:     General: Abdomen is flat. There is no distension.     Palpations: Abdomen is soft.     Tenderness: There is no abdominal tenderness.  Genitourinary:    Rectum: Normal.     Comments: The rectum is grossly normal in appearance.  I see no external hemorrhoids or obvious fissures.  Digital rectal exam deferred due to patient discomfort. Skin:    General: Skin is warm and dry.  Neurological:     Mental Status: He is alert.     ED Results / Procedures / Treatments   Labs (all labs ordered are listed,  but only abnormal results are displayed) Labs Reviewed - No data to display  EKG None  Radiology No results found.  Procedures Procedures   Medications Ordered in ED Medications - No data to display  ED Course  I have reviewed the triage vital signs and the nursing notes.  Pertinent labs & imaging results that were available during my care of the patient were reviewed by me and considered in my medical decision making (see chart for details).    MDM Rules/Calculators/A&P  Patient presenting with a 1 month history of burning in his rectum.  His physical examination is unremarkable we will with the exception of tenderness with attempted digital exam.  There are no obvious external hemorrhoids or fissures.  At this point, discharge seems appropriate.  Patient will be prescribed Proctofoam and a referral to GI will be placed.  He may require a  colonoscopy.  In the meantime I have advised Metamucil and Colace.  Final Clinical Impression(s) / ED Diagnoses Final diagnoses:  None    Rx / DC Orders ED Discharge Orders    None       Geoffery Lyons, MD 08/25/20 [phone]

## 2020-10-14 ENCOUNTER — Encounter: Payer: Self-pay | Admitting: Gastroenterology

## 2020-11-17 ENCOUNTER — Ambulatory Visit: Payer: 59 | Admitting: Gastroenterology

## 2021-01-07 ENCOUNTER — Ambulatory Visit: Payer: 59 | Admitting: Gastroenterology

## 2021-02-04 ENCOUNTER — Ambulatory Visit: Payer: 59 | Admitting: Gastroenterology

## 2021-03-04 ENCOUNTER — Ambulatory Visit: Payer: 59 | Admitting: Nurse Practitioner

## 2021-04-06 ENCOUNTER — Ambulatory Visit: Payer: 59 | Admitting: Nurse Practitioner

## 2023-03-21 ENCOUNTER — Ambulatory Visit: Payer: Self-pay

## 2023-03-21 ENCOUNTER — Other Ambulatory Visit: Payer: Self-pay | Admitting: Nurse Practitioner

## 2023-03-21 DIAGNOSIS — Z021 Encounter for pre-employment examination: Secondary | ICD-10-CM

## 2023-03-31 ENCOUNTER — Other Ambulatory Visit (HOSPITAL_COMMUNITY): Payer: Self-pay | Admitting: Family Medicine

## 2023-03-31 DIAGNOSIS — R221 Localized swelling, mass and lump, neck: Secondary | ICD-10-CM

## 2023-04-10 ENCOUNTER — Ambulatory Visit (HOSPITAL_COMMUNITY)
Admission: RE | Admit: 2023-04-10 | Discharge: 2023-04-10 | Disposition: A | Discharge status: Home / Self Care | Payer: No Typology Code available for payment source | Source: Ambulatory Visit | Attending: Family Medicine | Admitting: Family Medicine

## 2023-04-10 DIAGNOSIS — R221 Localized swelling, mass and lump, neck: Secondary | ICD-10-CM | POA: Insufficient documentation

## 2023-04-11 ENCOUNTER — Ambulatory Visit: Payer: No Typology Code available for payment source | Admitting: General Surgery

## 2023-04-13 ENCOUNTER — Encounter: Payer: Self-pay | Admitting: General Surgery

## 2023-04-13 ENCOUNTER — Ambulatory Visit (INDEPENDENT_AMBULATORY_CARE_PROVIDER_SITE_OTHER): Payer: No Typology Code available for payment source | Admitting: General Surgery

## 2023-04-13 VITALS — BP 123/73 | HR 69 | Temp 98.2°F | Resp 16 | Ht 74.0 in | Wt 260.0 lb

## 2023-04-13 DIAGNOSIS — L723 Sebaceous cyst: Secondary | ICD-10-CM

## 2023-04-13 MED ORDER — AMOXICILLIN-POT CLAVULANATE 875-125 MG PO TABS: 1.0000 | ORAL_TABLET | Freq: Two times a day (BID) | ORAL | 0 refills | Status: AC

## 2023-04-13 NOTE — Progress Notes (Signed)
[facility]  Reason for Referral: Neck cyst  Referring Physician: Benita Stabile, MD - PCP office    Chief Complaint   New Patient (Initial Visit)     Bobby Crane is a 32 y.o. male.  HPI: Mr. Bobby Crane is a 32 yo who has had multiple areas of cyst in the past. He had one area on his right posterior neck incised and drained in the past at Dr. Margo Aye the Dermatologist office he thinks.  He says that this area gets inflamed at times and that antibiotics do not really help. He says that it recurred after they tried to open it and remove it and that it was left with a scar over the area from secondary healing. He also has an area on the left posterior neck that is smaller. He had an [place] this week  of the areas.   History reviewed. No pertinent past medical history.  Past Surgical History:  Procedure Laterality Date   TONSILLECTOMY     WISDOM TOOTH EXTRACTION      Family History  Problem Relation Age of Onset   Breast cancer Mother    Diabetes Father    Prostate cancer Father     Social History   Tobacco Use   Smoking status: Every Day    Current packs/day: 1.00    Types: Cigarettes   Smokeless tobacco: Never  Vaping Use   Vaping status: Never Used  Substance Use Topics   Alcohol use: No    Comment: occasional   Drug use: No    Medications: I have reviewed the patient's current medications. Allergies as of 04/13/2023   No Known Allergies      Medication List        Accurate as of April 13, 2023 11:59 PM. If you have any questions, ask your nurse or doctor.          STOP taking these medications    amoxicillin 500 MG capsule Commonly known as: AMOXIL Stopped by: Lucretia Roers   doxycycline 100 MG capsule Commonly known as: VIBRAMYCIN Stopped by: Lucretia Roers   HYDROcodone-acetaminophen 5-325 MG tablet Commonly known as: NORCO/VICODIN Stopped by: Lucretia Roers   hydrocortisone 2.5 % rectal  cream Commonly known as: ANUSOL-HC Stopped by: Lucretia Roers   omeprazole 20 MG capsule Commonly known as: PRILOSEC Stopped by: Lucretia Roers   sulfacetamide 10 % ophthalmic solution Commonly known as: BLEPH-10 Stopped by: Lucretia Roers       TAKE these medications    amoxicillin-clavulanate 875-125 MG tablet Commonly known as: AUGMENTIN Take 1 tablet by mouth 2 (two) times daily for 7 days. Started by: Lucretia Roers   buPROPion 150 MG 24 hr tablet Commonly known as: WELLBUTRIN XL Take 150 mg by mouth daily.   Mounjaro 10 MG/0.5ML Pen Generic drug: tirzepatide Inject 10 mg into the skin once a week.   propranolol 10 MG tablet Commonly known as: INDERAL Take 10 mg by mouth 3 (three) times daily as needed.         ROS:  A comprehensive review of systems was negative except for: Integument/breast: positive for skin lesion(s) and cyst lesion that swell at times and are painful  Blood pressure 123/73, pulse 69, temperature 98.2 F (36.8 C), temperature source Oral, resp. rate 16, height 6\' 2"  (1.88 m), weight 260 lb (117.9 kg), SpO2 96%. Physical Exam Vitals reviewed.  HENT:     Head: Normocephalic.  Nose: Nose normal.     Mouth/Throat:     Mouth: Mucous membranes are moist.  Eyes:     Extraocular Movements: Extraocular movements intact.  Neck:     Comments: Right posterior neck superficial 3.5cm area with scar, soft and mobile, left posterior neck superficial 1.5cm area superficial and soft, mobile  Cardiovascular:     Rate and Rhythm: Normal rate and regular rhythm.  Pulmonary:     Effort: Pulmonary effort is normal.     Breath sounds: Normal breath sounds.  Abdominal:     General: There is no distension.     Palpations: Abdomen is soft.     Tenderness: There is no abdominal tenderness.  Musculoskeletal:        General: Normal range of motion.     Cervical back: Normal range of motion.  Skin:    General: Skin is warm.  Neurological:      General: No focal deficit present.     Mental Status: He is alert and oriented to person, place, and time.  Psychiatric:        Mood and Affect: Mood normal.        Behavior: Behavior normal.        Thought Content: Thought content normal.     Results: [place] Pending -reviewed images- larger cyst looks superficial with debride in it   Assessment & Plan:  Bobby Crane is a 32 y.o. male with 2 areas of sebaceous/ dermoid cyst that cause him issues. He has had the right side incised in the past but this recurred. He is wanting to try to get these removed so he stops having issues with swelling and pain. Discussed excision under local and risk of bleeding, infection, recurrence, sutures we leave in due to location.   Future Appointments  Date Time Provider Department Center  05/25/2023  3:00 PM Lucretia Roers, MD [facility] None   [place] should be read by that time too     Lucretia Roers 04/14/2023, 8:18 AM

## 2023-04-14 ENCOUNTER — Encounter: Payer: Self-pay | Admitting: General Surgery

## 2023-05-25 ENCOUNTER — Ambulatory Visit: Payer: No Typology Code available for payment source | Admitting: General Surgery

## 2023-06-22 ENCOUNTER — Encounter: Payer: Self-pay | Admitting: General Surgery

## 2023-06-22 ENCOUNTER — Ambulatory Visit (INDEPENDENT_AMBULATORY_CARE_PROVIDER_SITE_OTHER): Payer: No Typology Code available for payment source | Admitting: General Surgery

## 2023-06-22 VITALS — BP 122/80 | HR 81 | Temp 98.9°F | Resp 16 | Ht 74.0 in | Wt 255.0 lb

## 2023-06-22 DIAGNOSIS — L723 Sebaceous cyst: Secondary | ICD-10-CM | POA: Diagnosis not present

## 2023-06-22 NOTE — Progress Notes (Signed)
[facility] Procedure Note  06/22/23  Pre-procedure Diagnosis: Left neck cyst   Post-procedure Diagnosis: Same   Procedure(s) Performed: Excision of left neck cyst 1.5cm    Surgeon: Leatrice Jewels. Henreitta Leber, MD   Assistants: No qualified resident was available    Anesthesia: Lidocaine 1%    Specimens: Cyst    Estimated Blood Loss: Minimal  Wound Class: Clean contaminated    Procedure Indications: Mr. Bobby Crane is a 33 yo who has cyst on his neck that was noted on [place]. We discussed excision and risk of bleeding, infection, recurrence, finding something unexpected.   Findings: Cyst with disruption wall, minor spillage    Procedure: The patient was taken to the procedure room and placed on his right side. The left neck was prepared and draped in the usual sterile fashion. Lidocaine 1% was injected.   An incision was made over the area and carried down to the cyst and with sharp dissection with scissors the cyst was excised. The cyst was disrupted and there was minor spillage. The cyst was removed in its entirety and the cavity was irrigated. Hemostasis was achieved. The deep space was closed with 3-0 Vicryl interrupted and the skin was closed with 3-0 Nylon interrupted. Neosporin and bandage was placed.   Final inspection revealed acceptable hemostasis. The patient tolerated the procedure well.   Keep bandage in place for 48 hours.  Replace and put neosporin/ bacitracin on the area for the next 5-7 days after that.  After that you can leave open to air or cover as desired. Can get area wet after first 48 hours. Pat area dry.  Tylenol and ibuprofen for pain control. Call with issues.   Future Appointments  Date Time Provider Department Center  07/06/2023  2:45 PM Lucretia Roers, MD [facility] None     Algis Greenhouse, MD [facility] [address] [place], [place] [address] [phone] (office)

## 2023-06-22 NOTE — Patient Instructions (Signed)
Keep bandage in place for 48 hours.  Replace and put neosporin/ bacitracin on the area for the next 5-7 days after that.  After that you can leave open to air or cover as desired. Can get area wet after first 48 hours. Pat area dry.  Tylenol and ibuprofen for pain control. Call with issues.

## 2023-06-23 NOTE — Addendum Note (Signed)
Addended by: Legrand Rams B on: 06/23/2023 10:28 AM   Modules accepted: Orders

## 2023-06-28 LAB — PATHOLOGY REPORT

## 2023-06-28 LAB — TISSUE SPECIMEN

## 2023-06-28 NOTE — Progress Notes (Signed)
Ruptured epidermal cyst.

## 2023-07-06 ENCOUNTER — Ambulatory Visit (INDEPENDENT_AMBULATORY_CARE_PROVIDER_SITE_OTHER): Payer: No Typology Code available for payment source | Admitting: General Surgery

## 2023-07-06 ENCOUNTER — Encounter: Payer: Self-pay | Admitting: General Surgery

## 2023-07-06 VITALS — BP 114/74 | HR 82 | Temp 97.7°F | Resp 14 | Ht 74.0 in | Wt 261.0 lb

## 2023-07-06 DIAGNOSIS — L723 Sebaceous cyst: Secondary | ICD-10-CM

## 2023-07-06 NOTE — Patient Instructions (Signed)
Steristrips will peel off in the next 5-7 days. You can remove them once they are peeling off. It is ok to shower. Pat the area dry.

## 2023-07-07 NOTE — Progress Notes (Signed)
[facility]  Doing well. No issues. Neck healing.   BP 114/74   Pulse 82   Temp 97.7 F (36.5 C) (Oral)   Resp 14   Ht 6\' 2"  (1.88 m)   Wt 261 lb (118.4 kg)   SpO2 97%   BMI 33.51 kg/m  Sutures removed, steri strips placed   Pathology Features compatible with a ruptured and  inflamed epidermal inclusion cyst.   Patient s/p cyst excision. Doing well.   Steristrips will peel off in the next 5-7 days. You can remove them once they are peeling off. It is ok to shower. Pat the area dry.   Algis Greenhouse, MD [facility] [address] [place], [place] [address] [phone] (office)

## 2023-09-18 ENCOUNTER — Telehealth: Payer: Self-pay | Admitting: *Deleted

## 2023-09-18 NOTE — Telephone Encounter (Signed)
 Surgical Date: 06/22/2023 Procedure: Excision Sebaceous Cyst, Neck, Left  Received call from patient [phone]~ telephone.  Patient reports that he has possible recurrence of cyst to left side of neck. States that he has tenderness in the same area and he thinks that he feels hard knot under the skin. Denies drainage or warmth to touch.   Appointment scheduled for provider to evaluate.

## 2023-09-20 ENCOUNTER — Ambulatory Visit (INDEPENDENT_AMBULATORY_CARE_PROVIDER_SITE_OTHER): Admitting: General Surgery

## 2023-09-20 ENCOUNTER — Encounter: Payer: Self-pay | Admitting: General Surgery

## 2023-09-20 VITALS — BP 127/77 | HR 97 | Temp 98.7°F | Resp 16 | Ht 74.0 in | Wt 265.0 lb

## 2023-09-20 DIAGNOSIS — L723 Sebaceous cyst: Secondary | ICD-10-CM

## 2023-09-20 MED ORDER — AMOXICILLIN-POT CLAVULANATE 875-125 MG PO TABS: 1.0000 | ORAL_TABLET | Freq: Two times a day (BID) | ORAL | 0 refills | Status: AC

## 2023-09-20 NOTE — Patient Instructions (Addendum)
 Call us  and let us  know which day you want to have the cyst excision.  5/20 or 5/21  Take the antibiotic for 10 days. If you need th refill, it is already sent in.  DO NOT TAKE MOUNJARO AFTER 10/01/23 (has to be held for at least a week before surgery).

## 2023-09-20 NOTE — Progress Notes (Signed)
 [facility] History and Physical  Reason for Referral: Recurrent left posterior cyst  Referring Physician: Self   Chief Complaint   Follow-up     Bobby Crane is a 33 y.o. male.  HPI:   Patient with left posterior scalp recurrent sebaceous cyst. He has had more drainage and swelling in the area. We excised it in the office a few months ago and now it has recurred. Prior pathology with ruptured inflamed cyst.    History reviewed. No pertinent past medical history.  Past Surgical History:  Procedure Laterality Date   TONSILLECTOMY     WISDOM TOOTH EXTRACTION      Family History  Problem Relation Age of Onset   Breast cancer Mother    Diabetes Father    Prostate cancer Father     Social History   Tobacco Use   Smoking status: Every Day    Current packs/day: 1.00    Types: Cigarettes   Smokeless tobacco: Never  Vaping Use   Vaping status: Never Used  Substance Use Topics   Alcohol use: No    Comment: occasional   Drug use: No    Medications: I have reviewed the patient's current medications. Allergies as of 09/20/2023   No Known Allergies      Medication List        Accurate as of September 20, 2023 11:59 PM. If you have any questions, ask your nurse or doctor.          amoxicillin -clavulanate 875-125 MG tablet Commonly known as: AUGMENTIN  Take 1 tablet by mouth 2 (two) times daily for 10 days. Started by: Awilda Bogus   buPROPion 150 MG 24 hr tablet Commonly known as: WELLBUTRIN XL Take 150 mg by mouth daily.   Mounjaro 10 MG/0.5ML Pen Generic drug: tirzepatide Inject 10 mg into the skin once a week.   propranolol 10 MG tablet Commonly known as: INDERAL Take 10 mg by mouth 3 (three) times daily as needed.   sertraline 50 MG tablet Commonly known as: ZOLOFT Take 50 mg by mouth daily.         ROS:  A comprehensive review of systems was negative except for: Musculoskeletal: positive for swelling and drainage  left posterior neck cyst   Blood pressure 127/77, pulse 97, temperature 98.7 F (37.1 C), temperature source Oral, resp. rate 16, height 6\' 2"  (1.88 m), weight 265 lb (120.2 kg), SpO2 94%. Physical Exam Vitals reviewed.  HENT:     Head: Normocephalic.     Mouth/Throat:     Mouth: Mucous membranes are moist.  Eyes:     Extraocular Movements: Extraocular movements intact.  Neck:     Comments: Left posterior neck, 1.5cm swelling, redness, tender  Cardiovascular:     Rate and Rhythm: Normal rate.  Pulmonary:     Effort: Pulmonary effort is normal.  Abdominal:     General: There is no distension.     Palpations: Abdomen is soft.  Musculoskeletal:        General: Normal range of motion.  Skin:    General: Skin is warm.  Neurological:     General: No focal deficit present.     Mental Status: He is alert.  Psychiatric:        Mood and Affect: Mood normal.        Behavior: Behavior normal.     Results: None   Assessment and Plan:  Bobby Crane is a 33 y.o. male with recurrent cyst in  the left posterior neck. He had a ruptured inflamed cyst removed here in the office. Likely a portion of the wall remained and recurred. Discussed excision in the OR to get this completely removed. Discussed risk of bleeding, infection, recurrence. He is going to the beach and we will treat with antibiotics for now.   Call us  and let us  know which day you want to have the cyst excision.  5/20 or 5/21  Take the antibiotic for 10 days. If you need th refill, it is already sent in.  DO NOT TAKE MOUNJARO AFTER 10/01/23 (has to be held for at least a week before surgery).   All questions were answered to the satisfaction of the patient.   Awilda Bogus 09/22/2023, 9:19 PM

## 2023-09-22 NOTE — H&P (Signed)
 [facility] History and Physical  Reason for Referral: Recurrent left posterior cyst  Referring Physician: Self   Chief Complaint   Follow-up     Bobby Crane is a 33 y.o. male.  HPI:   Patient with left posterior scalp recurrent sebaceous cyst. He has had more drainage and swelling in the area. We excised it in the office a few months ago and now it has recurred. Prior pathology with ruptured inflamed cyst.    History reviewed. No pertinent past medical history.  Past Surgical History:  Procedure Laterality Date   TONSILLECTOMY     WISDOM TOOTH EXTRACTION      Family History  Problem Relation Age of Onset   Breast cancer Mother    Diabetes Father    Prostate cancer Father     Social History   Tobacco Use   Smoking status: Every Day    Current packs/day: 1.00    Types: Cigarettes   Smokeless tobacco: Never  Vaping Use   Vaping status: Never Used  Substance Use Topics   Alcohol use: No    Comment: occasional   Drug use: No    Medications: I have reviewed the patient's current medications. Allergies as of 09/20/2023   No Known Allergies      Medication List        Accurate as of September 20, 2023 11:59 PM. If you have any questions, ask your nurse or doctor.          amoxicillin -clavulanate 875-125 MG tablet Commonly known as: AUGMENTIN  Take 1 tablet by mouth 2 (two) times daily for 10 days. Started by: Awilda Bogus   buPROPion 150 MG 24 hr tablet Commonly known as: WELLBUTRIN XL Take 150 mg by mouth daily.   Mounjaro 10 MG/0.5ML Pen Generic drug: tirzepatide Inject 10 mg into the skin once a week.   propranolol 10 MG tablet Commonly known as: INDERAL Take 10 mg by mouth 3 (three) times daily as needed.   sertraline 50 MG tablet Commonly known as: ZOLOFT Take 50 mg by mouth daily.         ROS:  A comprehensive review of systems was negative except for: Musculoskeletal: positive for swelling and drainage  left posterior neck cyst   Blood pressure 127/77, pulse 97, temperature 98.7 F (37.1 C), temperature source Oral, resp. rate 16, height 6\' 2"  (1.88 m), weight 265 lb (120.2 kg), SpO2 94%. Physical Exam Vitals reviewed.  HENT:     Head: Normocephalic.     Mouth/Throat:     Mouth: Mucous membranes are moist.  Eyes:     Extraocular Movements: Extraocular movements intact.  Neck:     Comments: Left posterior neck, 1.5cm swelling, redness, tender  Cardiovascular:     Rate and Rhythm: Normal rate.  Pulmonary:     Effort: Pulmonary effort is normal.  Abdominal:     General: There is no distension.     Palpations: Abdomen is soft.  Musculoskeletal:        General: Normal range of motion.  Skin:    General: Skin is warm.  Neurological:     General: No focal deficit present.     Mental Status: He is alert.  Psychiatric:        Mood and Affect: Mood normal.        Behavior: Behavior normal.     Results: None   Assessment and Plan:  Bobby Crane is a 33 y.o. male with recurrent cyst in  the left posterior neck. He had a ruptured inflamed cyst removed here in the office. Likely a portion of the wall remained and recurred. Discussed excision in the OR to get this completely removed. Discussed risk of bleeding, infection, recurrence. He is going to the beach and we will treat with antibiotics for now.   Call us  and let us  know which day you want to have the cyst excision.  5/20 or 5/21  Take the antibiotic for 10 days. If you need th refill, it is already sent in.  DO NOT TAKE MOUNJARO AFTER 10/01/23 (has to be held for at least a week before surgery).   All questions were answered to the satisfaction of the patient.   Awilda Bogus 09/22/2023, 9:19 PM

## 2023-10-05 ENCOUNTER — Other Ambulatory Visit (HOSPITAL_COMMUNITY)

## 2023-10-09 ENCOUNTER — Encounter (HOSPITAL_COMMUNITY)
Admission: RE | Admit: 2023-10-09 | Discharge: 2023-10-09 | Disposition: A | Discharge status: Home / Self Care | Source: Ambulatory Visit | Attending: General Surgery | Admitting: General Surgery

## 2023-10-09 ENCOUNTER — Telehealth: Payer: Self-pay | Admitting: *Deleted

## 2023-10-09 NOTE — Pre-Procedure Instructions (Signed)
Attempted pre-op phone call. Left VM for him to cal [place] back

## 2023-10-09 NOTE — Telephone Encounter (Signed)
 Surgical Date: 10/10/2023 Procedure: Excision Cyst, Left Posterior Neck, 1.5 CM   Received call from patient [phone]~ telephone.   Patient requested to cancel procedure in OR. Requested in office procedure.   Advised that cyst is more likely to recur with in office procedure. Advised that provider recommended excision in OR to completely remove area.   Patient aware and would like to proceed with in office procedure.   Dr. Collene Dawson made aware and agreeable to change. Next in office procedure appointment scheduled 12/21/2023.

## 2023-10-10 ENCOUNTER — Ambulatory Visit (HOSPITAL_COMMUNITY): Admission: RE | Admit: 2023-10-10 | Source: Home / Self Care | Admitting: General Surgery

## 2023-10-10 ENCOUNTER — Encounter (HOSPITAL_COMMUNITY): Admission: RE | Payer: Self-pay | Source: Home / Self Care

## 2023-10-10 DIAGNOSIS — L723 Sebaceous cyst: Secondary | ICD-10-CM | POA: Diagnosis present

## 2023-10-10 SURGERY — EXCISION, CYST, NECK
Anesthesia: Choice | Laterality: Left

## 2023-12-21 ENCOUNTER — Ambulatory Visit: Admitting: General Surgery
